# Patient Record
Sex: Male | Born: 1980 | Race: Black or African American | Hispanic: No | Smoking: Current every day smoker
Health system: Southern US, Community
[De-identification: ages and names within clinical notes are randomized; demographics above are authoritative.]

## PROBLEM LIST (undated history)

## (undated) DIAGNOSIS — M1990 Unspecified osteoarthritis, unspecified site: Secondary | ICD-10-CM

---

## 2013-10-09 ENCOUNTER — Emergency Department (HOSPITAL_COMMUNITY)
Admission: EM | Admit: 2013-10-09 | Discharge: 2013-10-09 | Disposition: A | Discharge status: Home / Self Care | Payer: Medicaid - Out of State | Attending: Emergency Medicine | Admitting: Emergency Medicine

## 2013-10-09 ENCOUNTER — Emergency Department (HOSPITAL_COMMUNITY): Payer: Medicaid - Out of State

## 2013-10-09 ENCOUNTER — Encounter (HOSPITAL_COMMUNITY): Payer: Self-pay | Admitting: Emergency Medicine

## 2013-10-09 DIAGNOSIS — S0180XA Unspecified open wound of other part of head, initial encounter: Secondary | ICD-10-CM | POA: Insufficient documentation

## 2013-10-09 DIAGNOSIS — F17200 Nicotine dependence, unspecified, uncomplicated: Secondary | ICD-10-CM | POA: Insufficient documentation

## 2013-10-09 DIAGNOSIS — S0181XA Laceration without foreign body of other part of head, initial encounter: Secondary | ICD-10-CM

## 2013-10-09 DIAGNOSIS — F1010 Alcohol abuse, uncomplicated: Secondary | ICD-10-CM | POA: Insufficient documentation

## 2013-10-09 DIAGNOSIS — Z23 Encounter for immunization: Secondary | ICD-10-CM | POA: Insufficient documentation

## 2013-10-09 MED ORDER — TETANUS-DIPHTH-ACELL PERTUSSIS 5-2.5-18.5 LF-MCG/0.5 IM SUSP
0.5000 mL | Freq: Once | INTRAMUSCULAR | Status: AC
  Administered 2013-10-09: 0.5 mL via INTRAMUSCULAR
  Filled 2013-10-09: qty 0.5
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
## 2013-10-09 NOTE — ED Notes (Signed)
[place] [employer] at bedside to take patient's statement.  [employer] with patient on arrival.  Patient cursing and stating he is in pain.  Patient with strong smell of ETOH noted.
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
## 2013-10-09 NOTE — ED Notes (Signed)
Pt ambulatory to restroom, tolerated well, pt calmer, security remains at bedside,

## 2013-10-09 NOTE — ED Notes (Signed)
Pt speaking on the phone with his mother,

## 2015-06-11 IMAGING — CT CT CERVICAL SPINE W/O CM
4 of 5 series · 14 of 33 positions shown, 16 images · non-contrast
Comparison: None.

CLINICAL DATA: Vessel.  Laceration to left eyebrow.

EXAM:
CT HEAD WITHOUT CONTRAST
CT CERVICAL SPINE WITHOUT CONTRAST
TECHNIQUE: Multidetector CT imaging of the head and cervical spine was
performed following the standard protocol without intravenous
contrast. Multiplanar CT image reconstructions of the cervical spine
were also generated.

[Series 5: cervical st 2.0 b31s · axial · 0.31mm/px · z∈[-52,+62]mm · 4 of 96 slices shown, 5 images]
[im 20/96  soft-tissue]
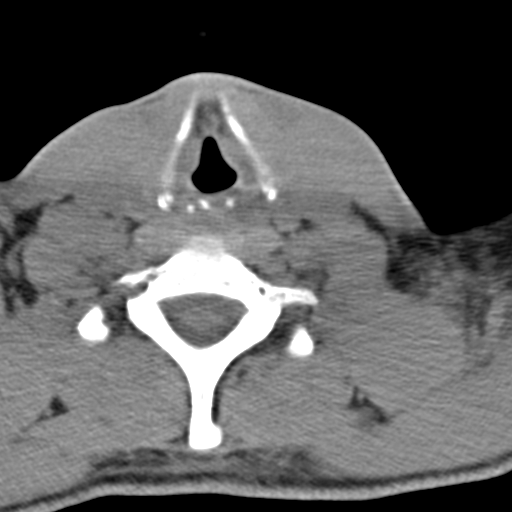
[im 20/96  bone]
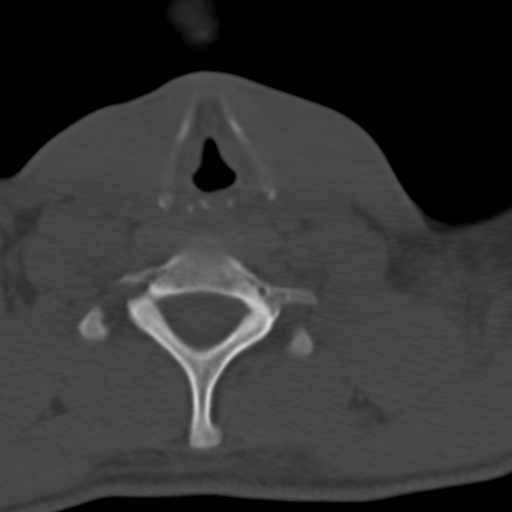
[im 39/96  bone]
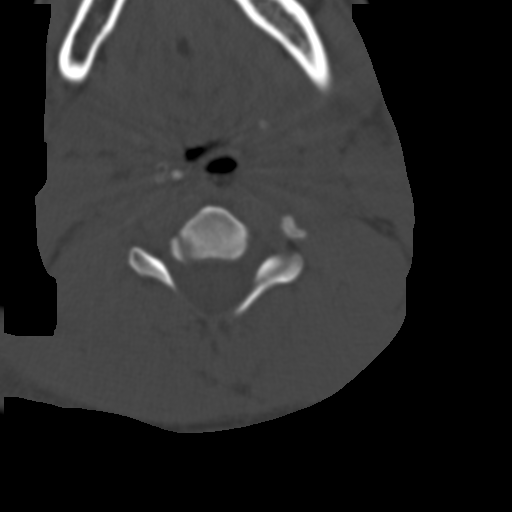
[im 58/96  bone]
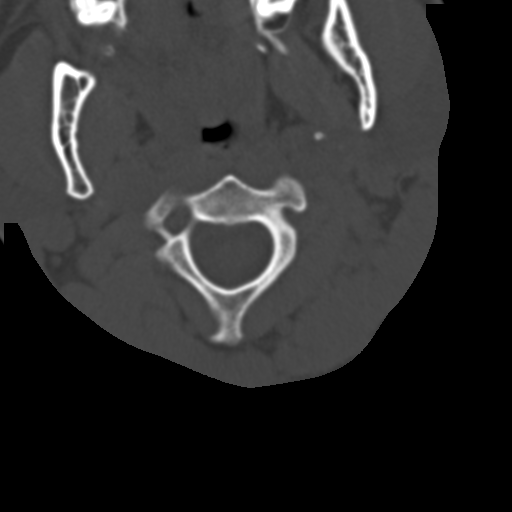
[im 77/96  bone]
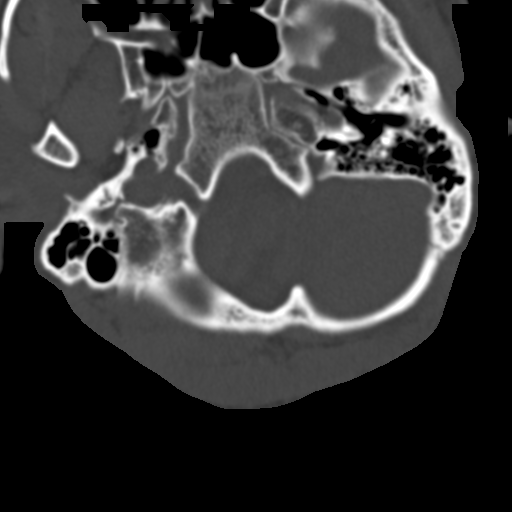

[Series 7: sagittal bone 2.0 · sagittal · 0.32mm/px · 5 of 60 slices shown, 6 images]
[im 20/60  bone]
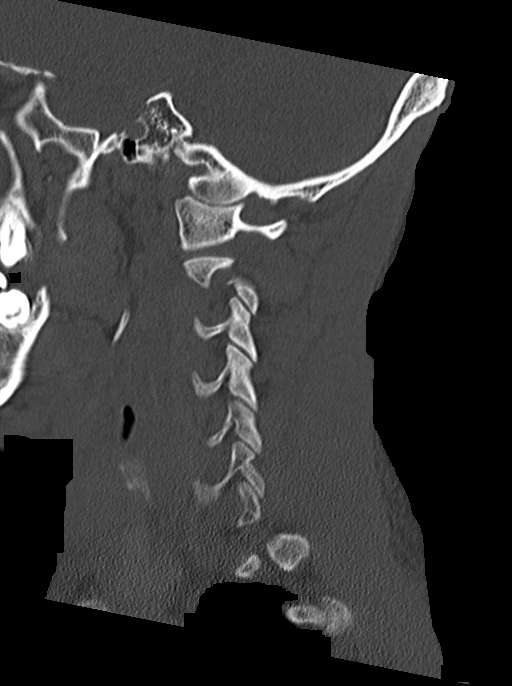
[im 25/60  bone]
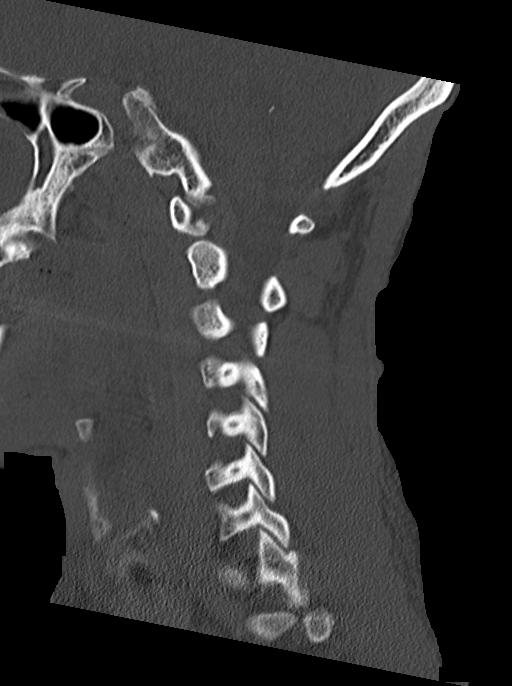
[im 30/60  soft-tissue]
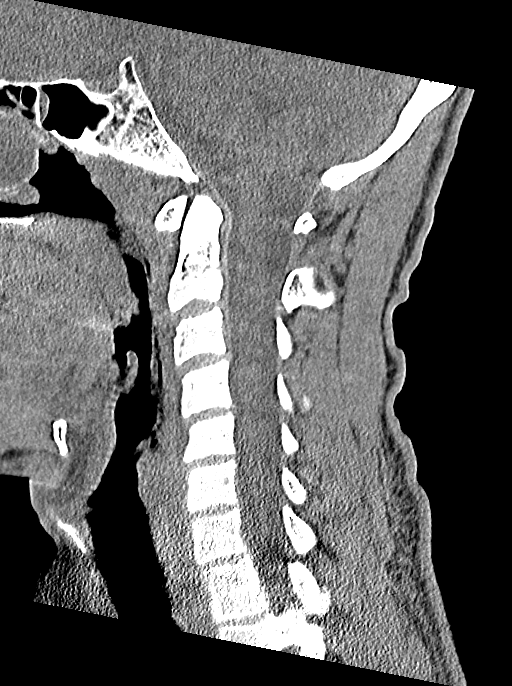
[im 30/60  bone]
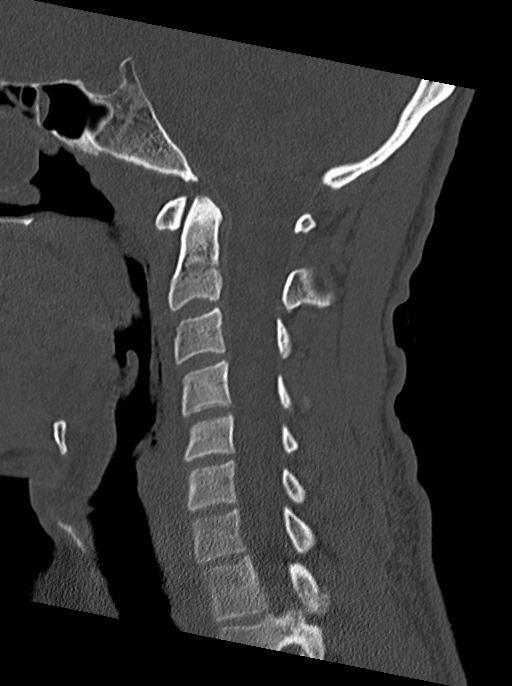
[im 35/60  bone]
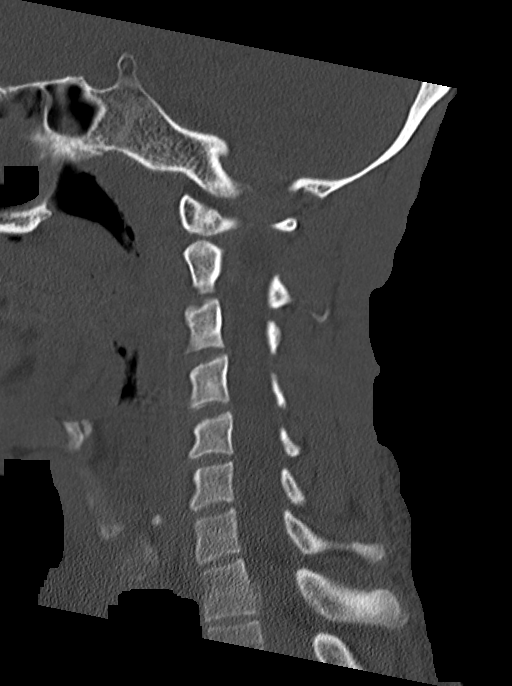
[im 40/60  bone]
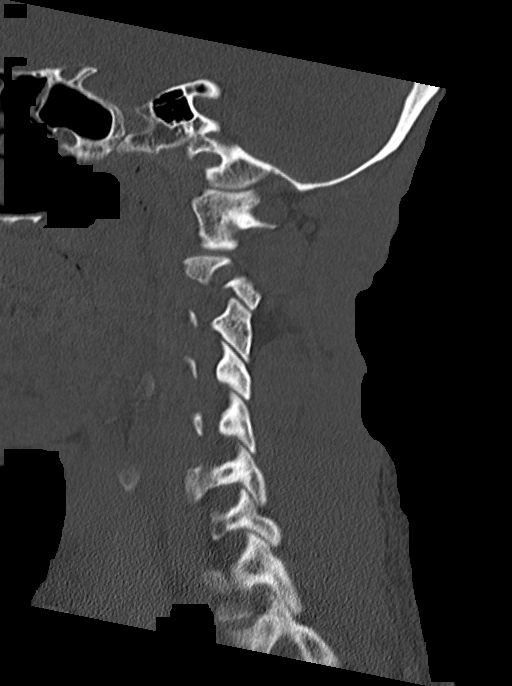

[Series 8: coronal bone 2.0 · coronal · 0.32mm/px · 3 of 71 slices shown]
[im 15/71  bone]
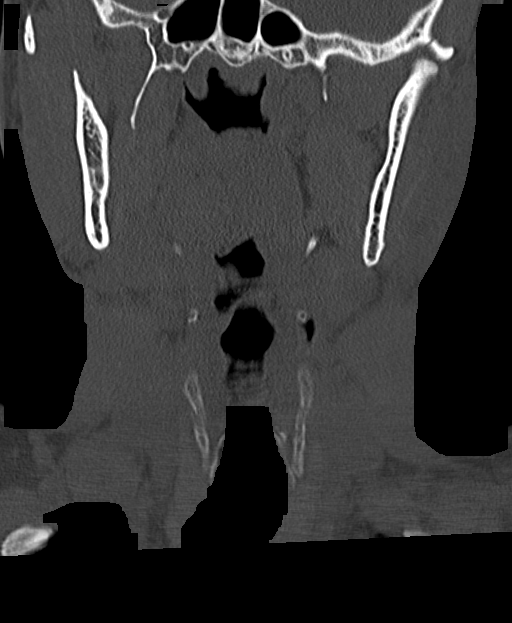
[im 29/71  bone]
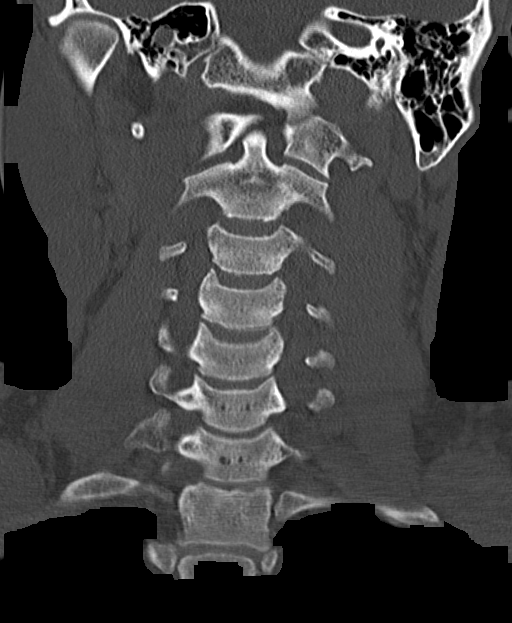
[im 43/71  bone]
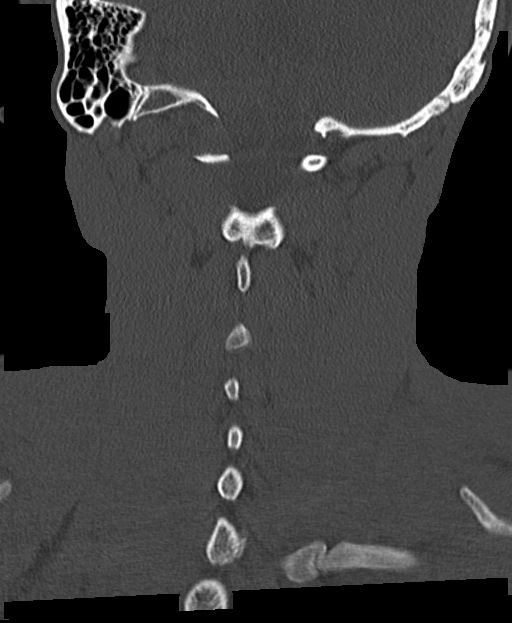

[Series 9: axial bone 2.0 · axial · 0.22mm/px · z∈[-76,-38]mm · 2 of 99 slices shown]
[im 20/99  bone]
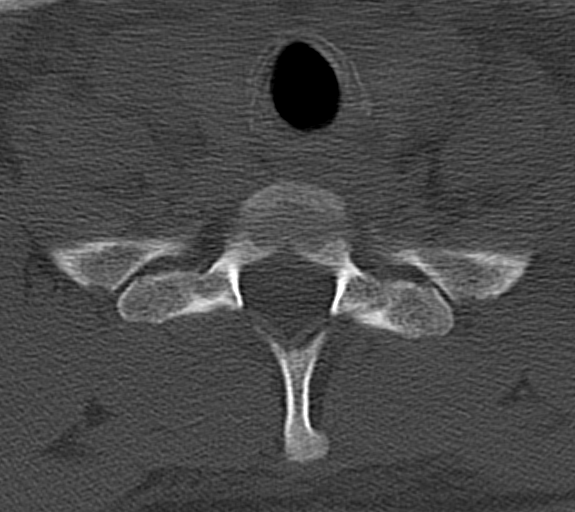
[im 40/99  bone]
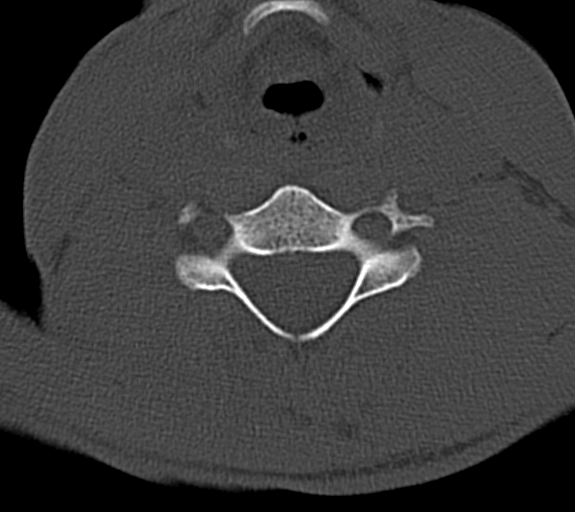

[14 of 33 positions shown; findings below may reference images not displayed]

FINDINGS: CT HEAD FINDINGS

Sinuses/Soft tissues: Minimal motion degradation. Clear paranasal
sinuses and mastoid air cells.

Intracranial: No mass lesion, hemorrhage, hydrocephalus, acute
infarct, intra-axial, or extra-axial fluid collection.

CT CERVICAL SPINE FINDINGS

Spinal visualization through the bottom of T1. Prevertebral soft
tissues are within normal limits.. Skull base intact. Maintenance of
vertebral body height. Straightening of expected lordosis. Facets
are well-aligned. Coronal reformats demonstrate a normal C1-C2
articulation.
IMPRESSION: 1. Mildly motion degraded evaluation of the head. Given this factor,
no acute intracranial abnormality.
2. No acute fracture or subluxation within the cervical spine.
Straightening of expected cervical lordosis could be positional, due
to muscular spasm, or ligamentous injury.

## 2016-05-04 ENCOUNTER — Emergency Department (HOSPITAL_COMMUNITY)
Admission: EM | Admit: 2016-05-04 | Discharge: 2016-05-04 | Disposition: A | Discharge status: Home / Self Care | Payer: Medicaid - Out of State | Attending: Emergency Medicine | Admitting: Emergency Medicine

## 2016-05-04 ENCOUNTER — Encounter (HOSPITAL_COMMUNITY): Payer: Self-pay

## 2016-05-04 ENCOUNTER — Emergency Department (HOSPITAL_COMMUNITY): Payer: Medicaid - Out of State

## 2016-05-04 DIAGNOSIS — X501XXA Overexertion from prolonged static or awkward postures, initial encounter: Secondary | ICD-10-CM | POA: Diagnosis not present

## 2016-05-04 DIAGNOSIS — T148XXA Other injury of unspecified body region, initial encounter: Secondary | ICD-10-CM

## 2016-05-04 DIAGNOSIS — S91332A Puncture wound without foreign body, left foot, initial encounter: Secondary | ICD-10-CM | POA: Insufficient documentation

## 2016-05-04 DIAGNOSIS — F17200 Nicotine dependence, unspecified, uncomplicated: Secondary | ICD-10-CM | POA: Diagnosis not present

## 2016-05-04 DIAGNOSIS — Y9361 Activity, american tackle football: Secondary | ICD-10-CM | POA: Insufficient documentation

## 2016-05-04 DIAGNOSIS — Y999 Unspecified external cause status: Secondary | ICD-10-CM | POA: Insufficient documentation

## 2016-05-04 DIAGNOSIS — Z23 Encounter for immunization: Secondary | ICD-10-CM | POA: Insufficient documentation

## 2016-05-04 DIAGNOSIS — S99911A Unspecified injury of right ankle, initial encounter: Secondary | ICD-10-CM | POA: Diagnosis present

## 2016-05-04 DIAGNOSIS — Y929 Unspecified place or not applicable: Secondary | ICD-10-CM | POA: Insufficient documentation

## 2016-05-04 DIAGNOSIS — S93401A Sprain of unspecified ligament of right ankle, initial encounter: Secondary | ICD-10-CM

## 2016-05-04 HISTORY — DX: Unspecified osteoarthritis, unspecified site: M19.90

## 2016-05-04 MED ORDER — IBUPROFEN 800 MG PO TABS
800.0000 mg | ORAL_TABLET | Freq: Once | ORAL | Status: AC
  Administered 2016-05-04: 800 mg via ORAL
  Filled 2016-05-04: qty 1

## 2016-05-04 MED ORDER — LIDOCAINE-EPINEPHRINE (PF) 1 %-1:200000 IJ SOLN
INTRAMUSCULAR | Status: AC
  Filled 2016-05-04: qty 30

## 2016-05-04 MED ORDER — TETANUS-DIPHTH-ACELL PERTUSSIS 5-2.5-18.5 LF-MCG/0.5 IM SUSP
0.5000 mL | Freq: Once | INTRAMUSCULAR | Status: AC
  Administered 2016-05-04: 0.5 mL via INTRAMUSCULAR
  Filled 2016-05-04: qty 0.5

## 2016-05-04 NOTE — Discharge Instructions (Signed)
Keep leg elevated at rest and ice to keep swelling down. Take ibuprofen and Tylenol for pain control. He can weight-bear as tolerated.  [place] Watch for signs of infection. Return without fail for worsening symptoms including increased redness, pus drainage, fever, or any other symptoms concerning to you.
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
## 2016-05-04 NOTE — ED Provider Notes (Signed)
[facility]-EMER[facility]ENCY DEPT Provider Note   CSN: [id number] Arrival date & time: 05/04/16  1611     History   Chief Complaint Chief Complaint  Patient presents with  . Ankle Injury    HPI Ethan Hill is a 35 y.o. male.  The history is provided by the patient.  Ankle Injury  This is a new problem. The current episode started 1 to 2 hours ago. The problem occurs rarely. The problem has not changed since onset.Pertinent negatives include no chest pain, no abdominal pain and no shortness of breath. The symptoms are aggravated by walking. Nothing relieves the symptoms. He has tried nothing for the symptoms.   35 year old male who presents with right foot injury. He is otherwise healthy. States that he was playing basketball today, did a jump shot, and landed wrong with ankle twisted. He fell on the ground, but did not hit his head or have any other serious injury. Was able to get up and finish the rest of the basketball game. When he took off his shoe he noticed that he was bleeding from a wound over the ankle. Has had gradual increased swelling over that area. States that he did not hit his foot against a foreign object, and denies any foreign bodies being in his shoe or puncturing his foot. No numbness or weakness. No other injuries. Currently 5/10 pain in the right ankle.    Past Medical History:  Diagnosis Date  . Arthritis     There are no active problems to display for this patient.   History reviewed. No pertinent surgical history.     Home Medications    Prior to Admission medications   Not on File    Family History No family history on file.  Social History Social History  Substance Use Topics  . Smoking status: Current Every Day Smoker  . Smokeless tobacco: Never Used  . Alcohol use Yes     Allergies   Review of patient's allergies indicates no known allergies.   Review of Systems Review of Systems  Respiratory: Negative for shortness of breath.     Cardiovascular: Negative for chest pain.  Gastrointestinal: Negative for abdominal pain.  Skin: Positive for wound.  Allergic/Immunologic: Negative for immunocompromised state.  Neurological: Negative for weakness and numbness.  Hematological: Does not bruise/bleed easily.  All other systems reviewed and are negative.    Physical Exam Updated Vital Signs BP 132/89 (BP Location: Left Arm)   Pulse 75   Temp 99.2 F (37.3 C) (Oral)   Resp 17   Ht 5\' 9"  (1.753 m)   Wt 168 lb (76.2 kg)   SpO2 99%   BMI 24.81 kg/m   Physical Exam Physical Exam  Nursing note and vitals reviewed. Constitutional: Well developed, well nourished, non-toxic, and in no acute distress Head: Normocephalic and atraumatic.  Mouth/Throat: Oropharynx is clear and moist.  Neck: Normal range of motion. Neck supple.  Cardiovascular: Normal rate and regular rhythm.  +2 DP pulses. Pulmonary/Chest: Effort normal and breath sounds normal.  Abdominal: Soft. There is no tenderness. There is no rebound and no guarding.  Musculoskeletal: Swelling of the right ankle w/ limited ROM due to pain Neurological: Alert, no facial droop, fluent speech, full strength dorsi/plantarflexion of great toe of the right foot, sensation to light touch in tact in bilateral lower extremities Skin: Skin is warm and dry.  Small puncture wound just inferior to the lateral malleolus of the right ankle.  Psychiatric: Cooperative   ED Treatments /  Results  Labs (all labs ordered are listed, but only abnormal results are displayed) Labs Reviewed - No data to display  EKG  EKG Interpretation None       Radiology Dg Ankle Complete Right  Result Date: 05/04/2016 CLINICAL DATA:  Puncture wound on the right foot. The patient reports an injury playing basketball. EXAM: RIGHT ANKLE - COMPLETE 3+ VIEW COMPARISON:  None. FINDINGS: Marked soft tissue swelling is seen about the lateral aspect of the ankle and lower leg. Gas is seen in the  lateral and anterior soft tissues extending approximately 12 cm above the tip of the lateral malleolus. No radiopaque foreign body is identified. No fracture dislocation. No joint effusion. IMPRESSION: Soft tissue swelling about the lateral and anterior aspect of the right lower leg and ankle with associated gas present which could be due to trauma or infection. No acute abnormality. Electronically Signed   By: Drusilla Kanner M.D.   On: 05/04/2016 17:37    Procedures .Marland KitchenLaceration Repair Date/Time: 05/04/2016 6:54 PM Performed by: Crista Curb DUO Authorized by: Crista Curb DUO   Consent:    Consent obtained:  Verbal   Consent given by:  Patient   Risks discussed:  Infection, pain, need for additional repair and retained foreign body Anesthesia (see MAR for exact dosages):    Anesthesia method:  None Laceration details:    Location: right ankle.   Length (cm):  1 Repair type:    Repair type:  Simple Pre-procedure details:    Preparation:  Patient was prepped and draped in usual sterile fashion and imaging obtained to evaluate for foreign bodies Exploration:    Hemostasis achieved with:  Direct pressure   Wound exploration: wound explored through full range of motion     Contaminated: no   Treatment:    Area cleansed with:  Betadine   Amount of cleaning:  Standard   Irrigation solution:  Tap water   Irrigation volume:  500   Irrigation method:  Syringe Skin repair:    Repair method:  Sutures   Suture size:  4-0   Suture material:  Nylon   Suture technique:  Simple interrupted   Number of sutures:  1 Approximation:    Approximation:  Loose   Vermilion border: well-aligned   Post-procedure details:    Dressing:  Non-adherent dressing and sterile dressing   Patient tolerance of procedure:  Tolerated well, no immediate complications   (including critical care time)  Medications Ordered in ED Medications  lidocaine-EPINEPHrine (XYLOCAINE-EPINEPHrine) 1 %-1:200000 (PF) injection  (not administered)  Tdap (BOOSTRIX) injection 0.5 mL (0.5 mLs Intramuscular Given 05/04/16 1720)  ibuprofen (ADVIL,MOTRIN) tablet 800 mg (800 mg Oral Given 05/04/16 1720)     Initial Impression / Assessment and Plan / ED Course  I have reviewed the triage vital signs and the nursing notes.  Pertinent labs & imaging results that were available during my care of the patient were reviewed by me and considered in my medical decision making (see chart for details).  Clinical Course    Presenting with right ankle injury after playing basketball. Right ankle with soft tissue swelling around the lateral malleolus but neurovascularly intact. There is a 1 cm puncture like wound to the inferior aspect of the lateral malleolus. As his friends told him that he had fallen into area of broken branches with thorns, which is likely what caused his puncture wound. Xray w/o fracture or dislocation. Likely component of ankle sprain but he has been ambulating on it w/o  difficulty. There is gas in soft tissue from puncture wound. No concern for infection. Loose suture was placed. Wound care instructions discussed.  Strict return and follow-up instructions reviewed. He expressed understanding of all discharge instructions and felt comfortable with the plan of care.   The patient appears reasonably screened and/or stabilized for discharge and I doubt any other medical condition or other [facility] requiring further screening, evaluation, or treatment in the ED at this time prior to discharge.   Final Clinical Impressions(s) / ED Diagnoses   Final diagnoses:  Ankle sprain, right, initial encounter  Puncture wound    New Prescriptions New Prescriptions   No medications on file     Lavera Guiseana Duo Liu, MD 05/04/16 1859

## 2018-01-04 IMAGING — DX DG ANKLE COMPLETE 3+V*R*
3 series · 3 of 3 positions shown · non-contrast
Comparison: None.

CLINICAL DATA: Puncture wound on the right foot. The patient
reports an injury playing basketball.

EXAM:
RIGHT ANKLE - COMPLETE 3+ VIEW

[ankle ap]
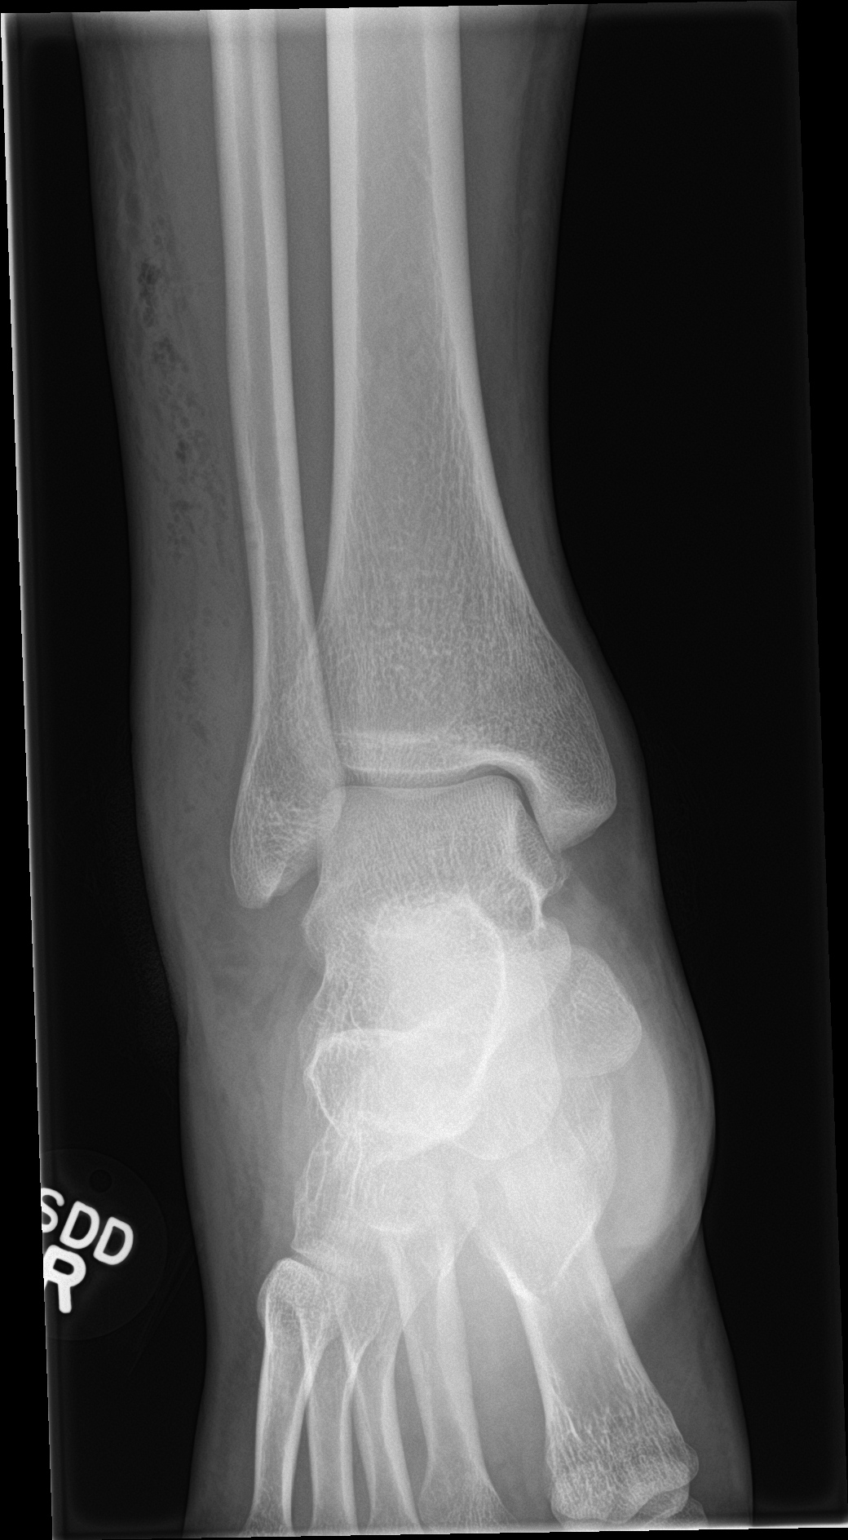

[ankle obl]
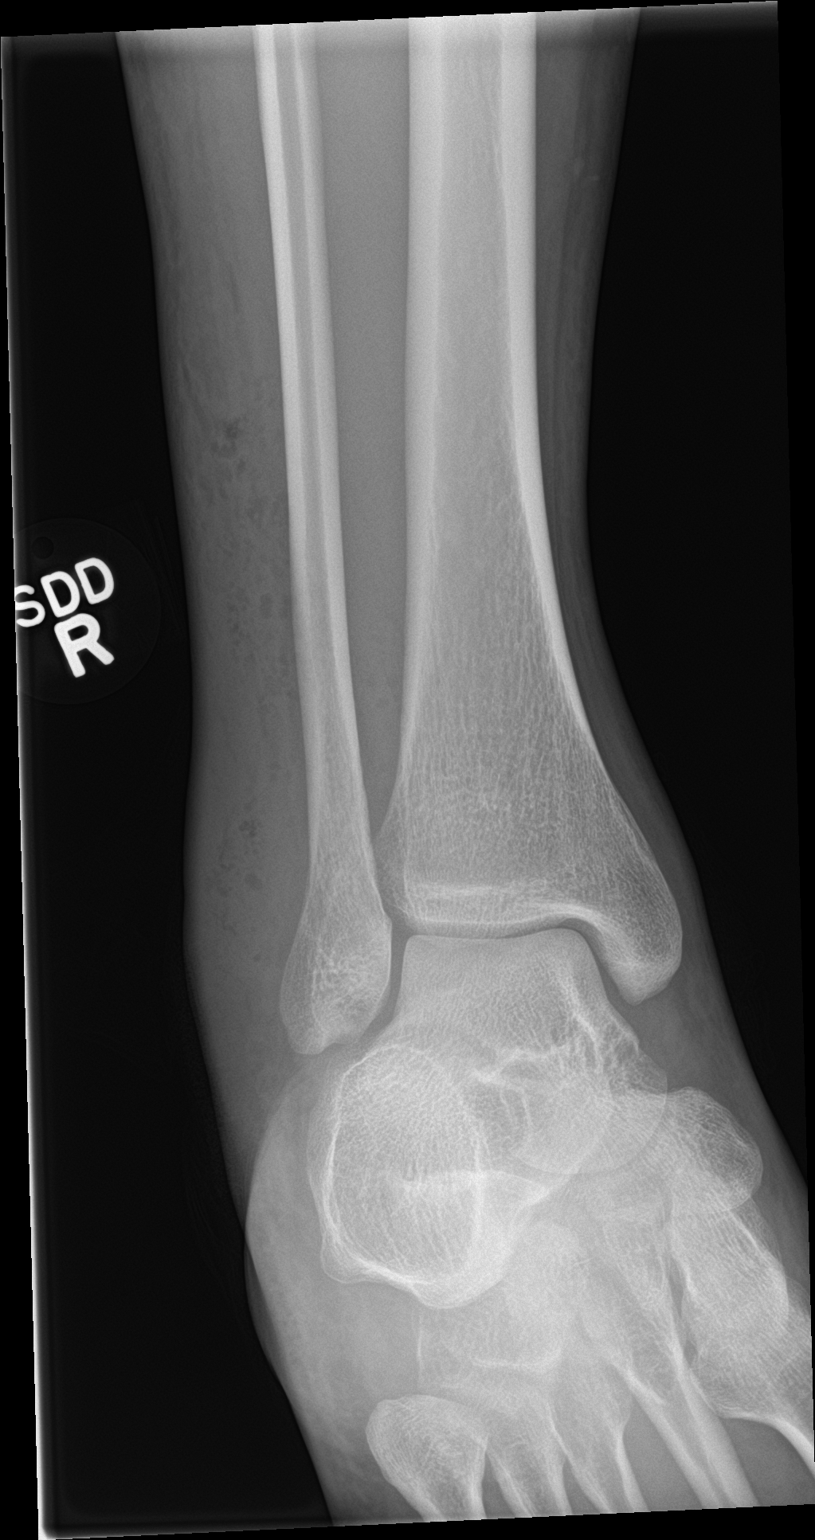

[ankle lat]
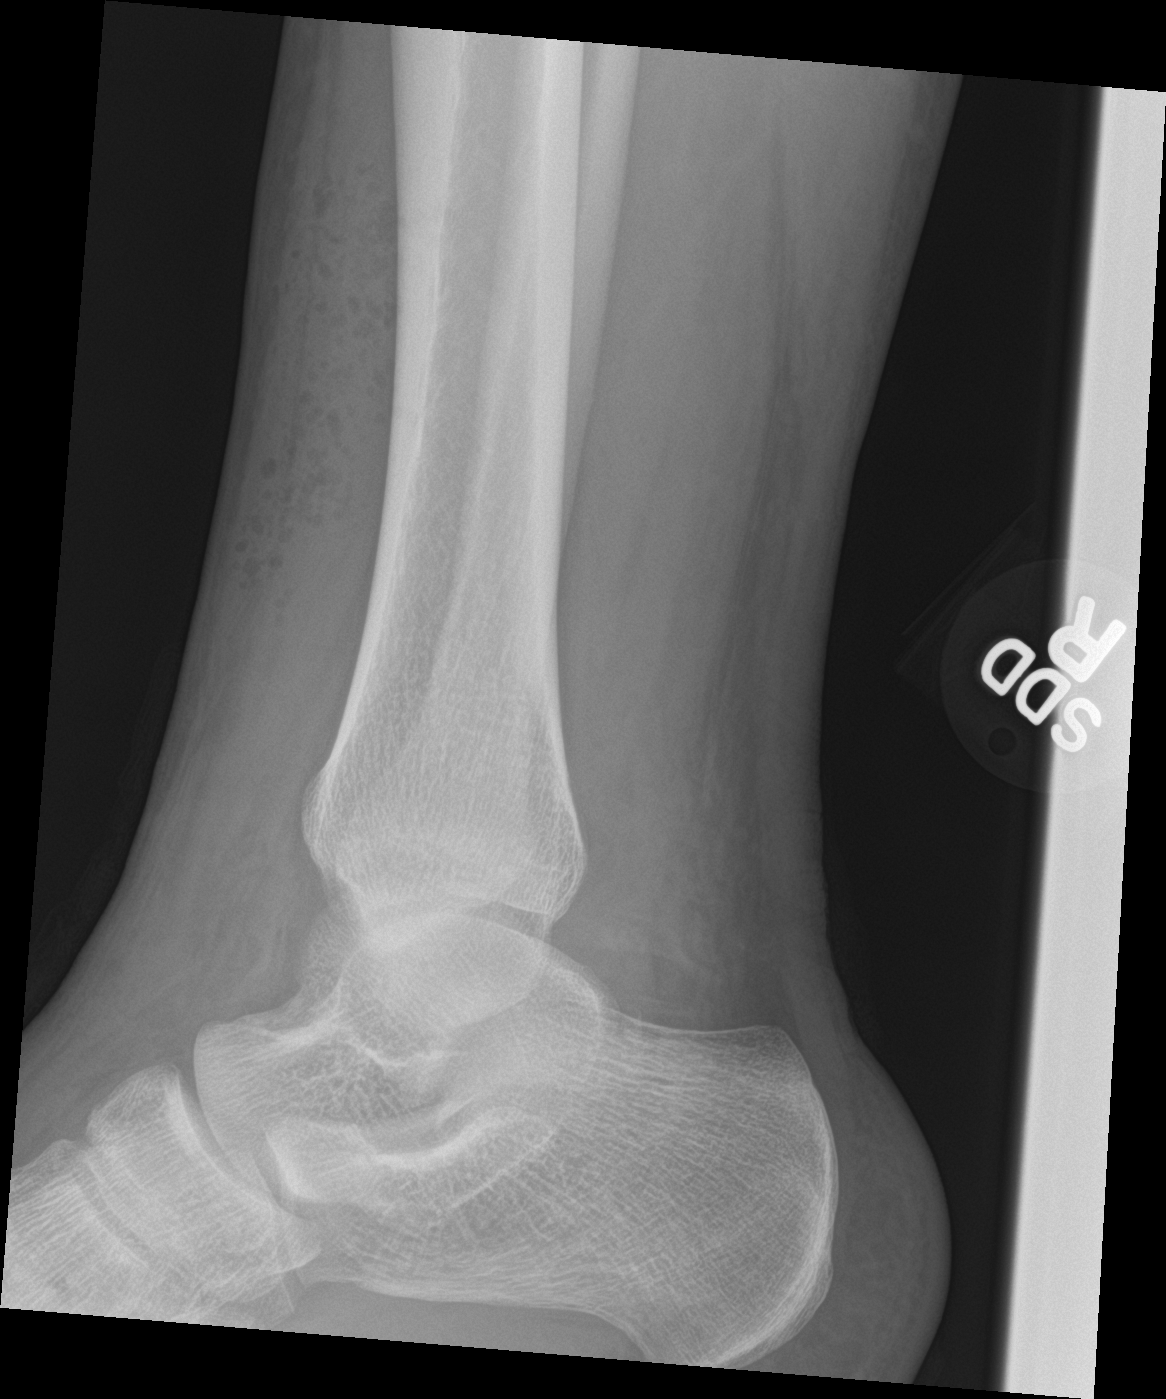

[3 of 3 positions shown; findings below may reference images not displayed]

FINDINGS: Marked soft tissue swelling is seen about the lateral aspect of the
ankle and lower leg. Gas is seen in the lateral and anterior soft
tissues extending approximately 12 cm above the tip of the lateral
malleolus. No radiopaque foreign body is identified. No fracture
dislocation. No joint effusion.
IMPRESSION: Soft tissue swelling about the lateral and anterior aspect of the
right lower leg and ankle with associated gas present which could be
due to trauma or infection.

No acute abnormality.
